# Patient Record
Sex: Female | Born: 1970 | Race: White | Hispanic: No | Marital: Married | Smoking: Current every day smoker
Health system: Southern US, Community
[De-identification: ages and names within clinical notes are randomized; demographics above are authoritative.]

## PROBLEM LIST (undated history)

## (undated) ENCOUNTER — Emergency Department: Admission: EM | Payer: Self-pay | Source: Home / Self Care

---

## 1999-01-18 ENCOUNTER — Inpatient Hospital Stay (HOSPITAL_COMMUNITY): Admission: AD | Admit: 1999-01-18 | Discharge: 1999-01-21 | Payer: Self-pay | Admitting: Obstetrics and Gynecology

## 2001-02-24 ENCOUNTER — Other Ambulatory Visit: Admission: RE | Admit: 2001-02-24 | Discharge: 2001-02-24 | Payer: Self-pay | Admitting: Obstetrics and Gynecology

## 2002-03-08 ENCOUNTER — Other Ambulatory Visit: Admission: RE | Admit: 2002-03-08 | Discharge: 2002-03-08 | Payer: Self-pay | Admitting: Obstetrics and Gynecology

## 2003-06-08 ENCOUNTER — Inpatient Hospital Stay (HOSPITAL_COMMUNITY): Admission: AD | Admit: 2003-06-08 | Discharge: 2003-06-10 | Payer: Self-pay | Admitting: Obstetrics and Gynecology

## 2003-07-08 ENCOUNTER — Other Ambulatory Visit: Admission: RE | Admit: 2003-07-08 | Discharge: 2003-07-08 | Payer: Self-pay | Admitting: Obstetrics & Gynecology

## 2004-07-30 ENCOUNTER — Other Ambulatory Visit: Admission: RE | Admit: 2004-07-30 | Discharge: 2004-07-30 | Payer: Self-pay | Admitting: Obstetrics and Gynecology

## 2008-03-02 IMAGING — US MAMMO-LUNI-US
1 series · 4 of 4 positions shown · non-contrast
Comparison: NONE

CLINICAL DATA: Mahefa Galon, RT(R)(M)(CT)  Diagnostic 
mammogram.  

LEFT BREAST MAMMOGRAM ADDITIONAL VIEWS AND LEFT BREAST ULTRASOUND

[Series 1: us breast · 0.07mm/px · 4 of 4 slices shown]
[im 1/4]
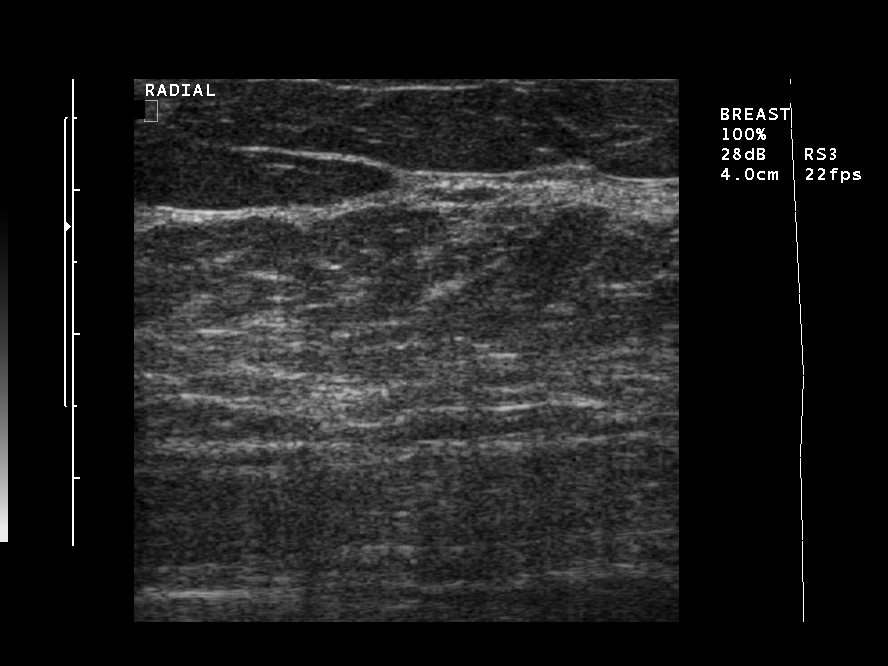
[im 2/4]
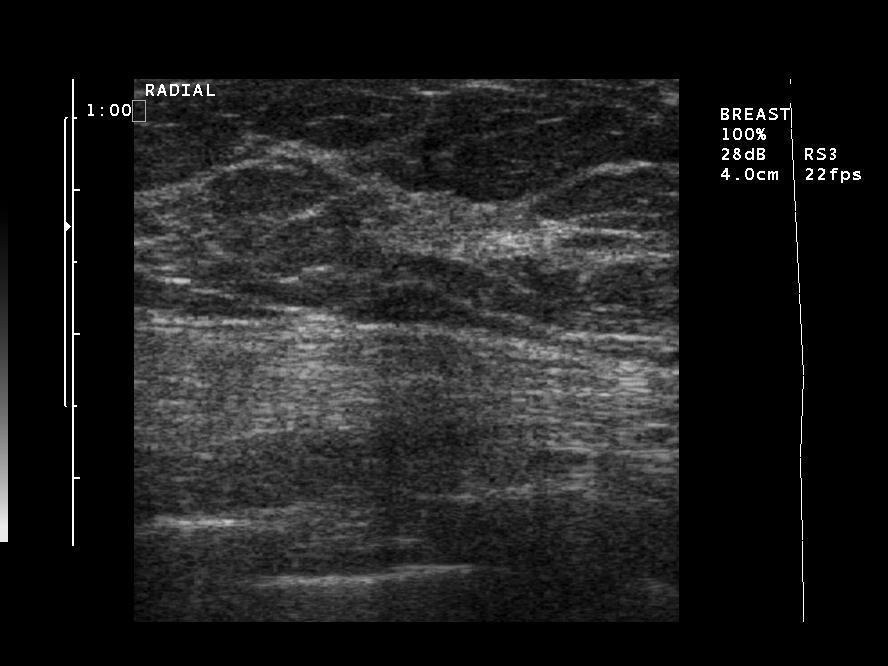
[im 3/4]
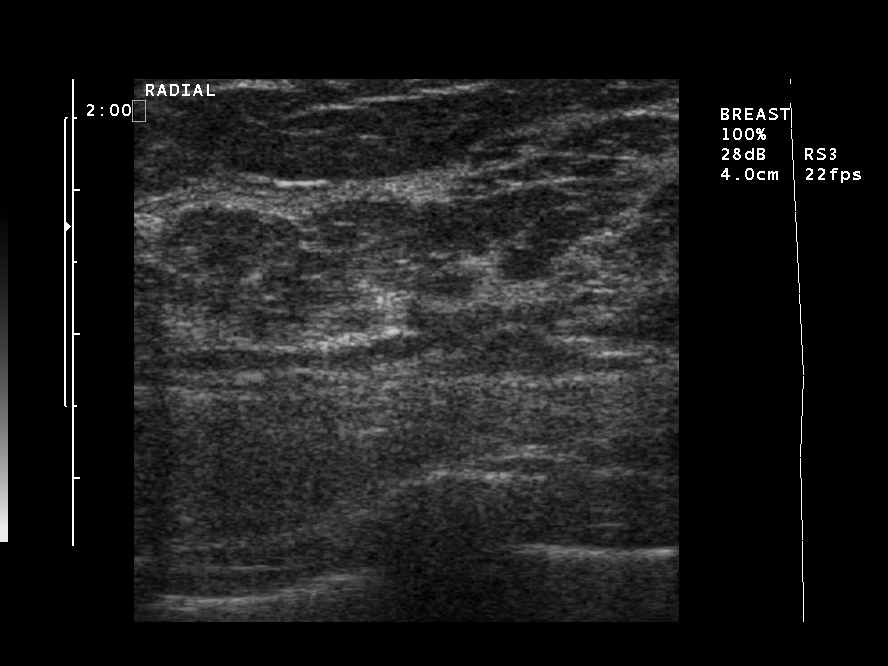
[im 4/4]
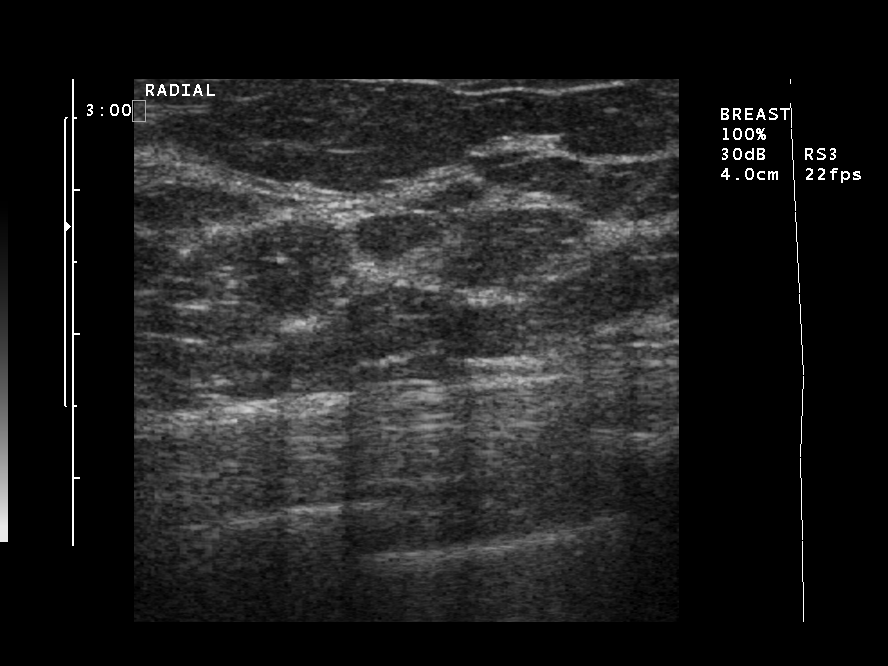

[4 of 4 positions shown; findings below may reference images not displayed]

FINDINGS: There is mild parenchymal breast density. A spot 
compression view in the true lateral position was obtained as well 
as a spot compression view of the outer aspect of the left breast 
in the CC view. Exaggerated medial and lateral CC images were also 
obtained. There is an oval-shaped mass seen in the upper outer 
quadrant. This measures approximately 9 x 6 x 5 mm. This appears 
to have a fatty hilum. The margin appears to be well circumscribed 
with gradual undulation on the MLO view. The margin is partially 
obscured by adjacent tissue on the lateral exaggerated CC view. 
There are no malignant-appearing microcalcifications or 
architectural distortion. Ultrasound was performed of the upper 
half of the left breast. I scanned the patient as well. There were 
no solid or cystic masses seen on ultrasound. No abnormal acoustic 
attenuation.
IMPRESSION: Oval-shaped mass seen in the upper outer quadrant of 
the left breast. This has mammographic features suggesting a lymph 
node. This was not visualized on ultrasound. I would recommend a 
3-month interval follow-up left breast mammogram to assess 
stability of this finding. The patient was informed at the time of 
the examination of the findings and recommendations by verbal and 
written lay report. Computer assisted (Second Look) technology was 
used as an aid in interpretation of this study. BI-RADS 3 - 
12/23/2006 Dict Date: 12/22/2006  Trans Date: 12/23/2006 JH  [REDACTED]

## 2016-11-22 ENCOUNTER — Emergency Department (HOSPITAL_BASED_OUTPATIENT_CLINIC_OR_DEPARTMENT_OTHER): Payer: 59

## 2016-11-22 ENCOUNTER — Emergency Department (HOSPITAL_BASED_OUTPATIENT_CLINIC_OR_DEPARTMENT_OTHER)
Admission: EM | Admit: 2016-11-22 | Discharge: 2016-11-22 | Disposition: A | Discharge status: Home / Self Care | Payer: 59 | Attending: Emergency Medicine | Admitting: Emergency Medicine

## 2016-11-22 ENCOUNTER — Encounter (HOSPITAL_BASED_OUTPATIENT_CLINIC_OR_DEPARTMENT_OTHER): Payer: Self-pay

## 2016-11-22 DIAGNOSIS — R002 Palpitations: Secondary | ICD-10-CM | POA: Insufficient documentation

## 2016-11-22 DIAGNOSIS — F17210 Nicotine dependence, cigarettes, uncomplicated: Secondary | ICD-10-CM | POA: Insufficient documentation

## 2016-11-22 LAB — CBC
HCT: 42.1 % (ref 36.0–46.0)
Hemoglobin: 14 g/dL (ref 12.0–15.0)
MCH: 29.5 pg (ref 26.0–34.0)
MCHC: 33.3 g/dL (ref 30.0–36.0)
MCV: 88.8 fL (ref 78.0–100.0)
PLATELETS: 338 10*3/uL (ref 150–400)
RBC: 4.74 MIL/uL (ref 3.87–5.11)
RDW: 12.9 % (ref 11.5–15.5)
WBC: 8.4 10*3/uL (ref 4.0–10.5)

## 2016-11-22 LAB — BASIC METABOLIC PANEL
Anion gap: 11 (ref 5–15)
BUN: 10 mg/dL (ref 6–20)
CO2: 23 mmol/L (ref 22–32)
CREATININE: 0.67 mg/dL (ref 0.44–1.00)
Calcium: 9.3 mg/dL (ref 8.9–10.3)
Chloride: 101 mmol/L (ref 101–111)
GFR calc Af Amer: >60 mL/min (ref >60)
Glucose, Bld: 127 mg/dL — ABNORMAL HIGH (ref 65–99)
Potassium: 3.5 mmol/L (ref 3.5–5.1)
SODIUM: 135 mmol/L (ref 135–145)

## 2016-11-22 LAB — TROPONIN I
Troponin I: <0.03 ng/mL (ref <0.03)
Troponin I: <0.03 ng/mL (ref <0.03)

## 2016-11-22 LAB — TSH: TSH: 3.948 u[IU]/mL (ref 0.350–4.500)

## 2016-11-22 NOTE — ED Notes (Signed)
Pt states she feels like heart was racing almost like panic attack that she has had in the past, palpations  Come and go  She states

## 2016-11-22 NOTE — ED Provider Notes (Signed)
Pt signed out to me at shift change by A. Law PA-C. She has had palpitations and chest squeezing since this morning intermittently. Delta trop and TSH pending.  Delta troponin is negative. She has been asymptomatic throughout her ED stay. Will d/c with cardiology f/u.   Linda Wheeler, Linda Aguilera Marie, PA-C 11/22/16 Pearletha [id number]    Schlossman, Erin, MD 11/27/16 [phone]

## 2016-11-22 NOTE — ED Provider Notes (Addendum)
[facility]   CSN: [id number] Arrival date & time: 11/22/16  1418     History   Chief Complaint Chief Complaint  Patient presents with  . Palpitations    HPI Linda Wheeler is a 46 y.o. female who is previously healthy who presents with palpitations. Patient reports waking up around 6:30 AM this morning and feeling a feeling like her heart was racing in addition to a squeezing in her left-sided chest. It lasted for a few seconds and resolved. Patient had a few other episodes, but has not had an episode in a few hours. She denies any significant chest pain, but did note the squeezing above. She denies any shortness of breath. Patient does have a family history of heart problems, but no personal cardiac history. She denies any abdominal pain, nausea, vomiting, urinary symptoms.  HPI  History reviewed. No pertinent past medical history.  There are no active problems to display for this patient.   History reviewed. No pertinent surgical history.  OB History    No data available       Home Medications    Prior to Admission medications   Not on File    Family History No family history on file.  Social History Social History  Substance Use Topics  . Smoking status: Current Every Day Smoker  . Smokeless tobacco: Never Used  . Alcohol use Yes     Comment: weekly     Allergies   Patient has no known allergies.   Review of Systems Review of Systems   Physical Exam Updated Vital Signs BP (!) 159/95 (BP Location: Left Arm)   Pulse 80   Temp 98.2 F (36.8 C) (Oral)   Resp 20   Ht 5\' 2"  (1.575 m)   Wt 97.5 kg (215 lb)   LMP 11/12/2016   SpO2 99%   BMI 39.32 kg/m   Physical Exam  Constitutional: She appears well-developed and well-nourished. No distress.  HENT:  Head: Normocephalic and atraumatic.  Mouth/Throat: Oropharynx is clear and moist. No oropharyngeal exudate.  Eyes: Pupils are equal, round, and reactive to light.  Conjunctivae are normal. Right eye exhibits no discharge. Left eye exhibits no discharge. No scleral icterus.  Neck: Normal range of motion. Neck supple. No thyromegaly present.  Cardiovascular: Normal rate, regular rhythm, normal heart sounds and intact distal pulses.  Exam reveals no gallop and no friction rub.   No murmur heard. Pulmonary/Chest: Effort normal and breath sounds normal. No stridor. No respiratory distress. She has no wheezes. She has no rales.  Abdominal: Soft. Bowel sounds are normal. She exhibits no distension. There is no tenderness. There is no rebound and no guarding.  Musculoskeletal: She exhibits no edema.  Lymphadenopathy:    She has no cervical adenopathy.  Neurological: She is alert. Coordination normal.  Skin: Skin is warm and dry. No rash noted. She is not diaphoretic. No pallor.  Psychiatric: She has a normal mood and affect.  Nursing note and vitals reviewed.    ED Treatments / Results  Labs (all labs ordered are listed, but only abnormal results are displayed) Labs Reviewed  BASIC METABOLIC PANEL - Abnormal; Notable for the following:       Result Value   Glucose, Bld 127 (*)    All other components within normal limits  CBC  TROPONIN I  TSH  TROPONIN I    EKG  EKG Interpretation  Date/Time:  Monday November 22 2016 14:22:31 EDT Ventricular Rate:  77 PR Interval:  150 QRS Duration: 96 QT Interval:  382 QTC Calculation: 432 R Axis:   85 Text Interpretation:  Normal sinus rhythm Cannot rule out Anterior infarct , age undetermined Abnormal ECG No STEMI.  Confirmed by Alona BeneLong, Joshua [phone]([id number]) on 11/22/2016 3:09:21 PM       Radiology Dg Chest 2 View  Result Date: 11/22/2016 CLINICAL DATA:  Palpitations EXAM: CHEST  2 VIEW COMPARISON:  None. FINDINGS: The heart size and mediastinal contours are within normal limits. Both lungs are clear. The visualized skeletal structures are unremarkable. IMPRESSION: No active cardiopulmonary disease. Electronically  Signed   By: Alcide CleverMark  Lukens M.D.   On: 11/22/2016 14:43    Procedures Procedures (including critical care time)  Medications Ordered in ED Medications - No data to display   Initial Impression / Assessment and Plan / ED Course  I have reviewed the triage vital signs and the nursing notes.  Pertinent labs & imaging results that were available during my care of the patient were reviewed by me and considered in my medical decision making (see chart for details).     CBC, BMP unremarkable. Initial troponin negative, delta pending. TSH pending. CXR is negative. EKG shows NSR. At shift change, patient care transferred to Terance HartKelly Gekas, PA-C for continued evaluation, follow up of delta troponin and TSH and determination of disposition. Anticipate discharge if TSH and delta trop negative. Follow-up to cardiology for further evaluation.   Final Clinical Impressions(s) / ED Diagnoses   Final diagnoses:  Palpitations    New Prescriptions New Prescriptions   No medications on file     Verdis PrimeLaw, Deantre Bourdon M, PA-C 11/22/16 1645    Long, Arlyss RepressJoshua G, MD 11/22/16 1921    Emi HolesLaw, Daysha Ashmore M, PA-C 11/29/16 1229    Maia PlanLong, Joshua G, MD 11/29/16 [phone]

## 2016-11-22 NOTE — Discharge Instructions (Signed)
Please follow-up with Dr. Jens Somrenshaw, a cardiologist, for further evaluation and treatment of her symptoms. Please return to the emergency department if you develop any new or worsening symptoms. Make sure to stay well-hydrated and avoid caffeine in excess amounts.

## 2016-11-22 NOTE — ED Triage Notes (Signed)
C/o palpitations x today-denies pain-NAD-steady gait

## 2018-02-01 IMAGING — DX DG CHEST 2V
2 series · 2 of 2 positions shown · non-contrast
Comparison: None.

CLINICAL DATA: Palpitations

EXAM:
CHEST  2 VIEW

[chest pa]
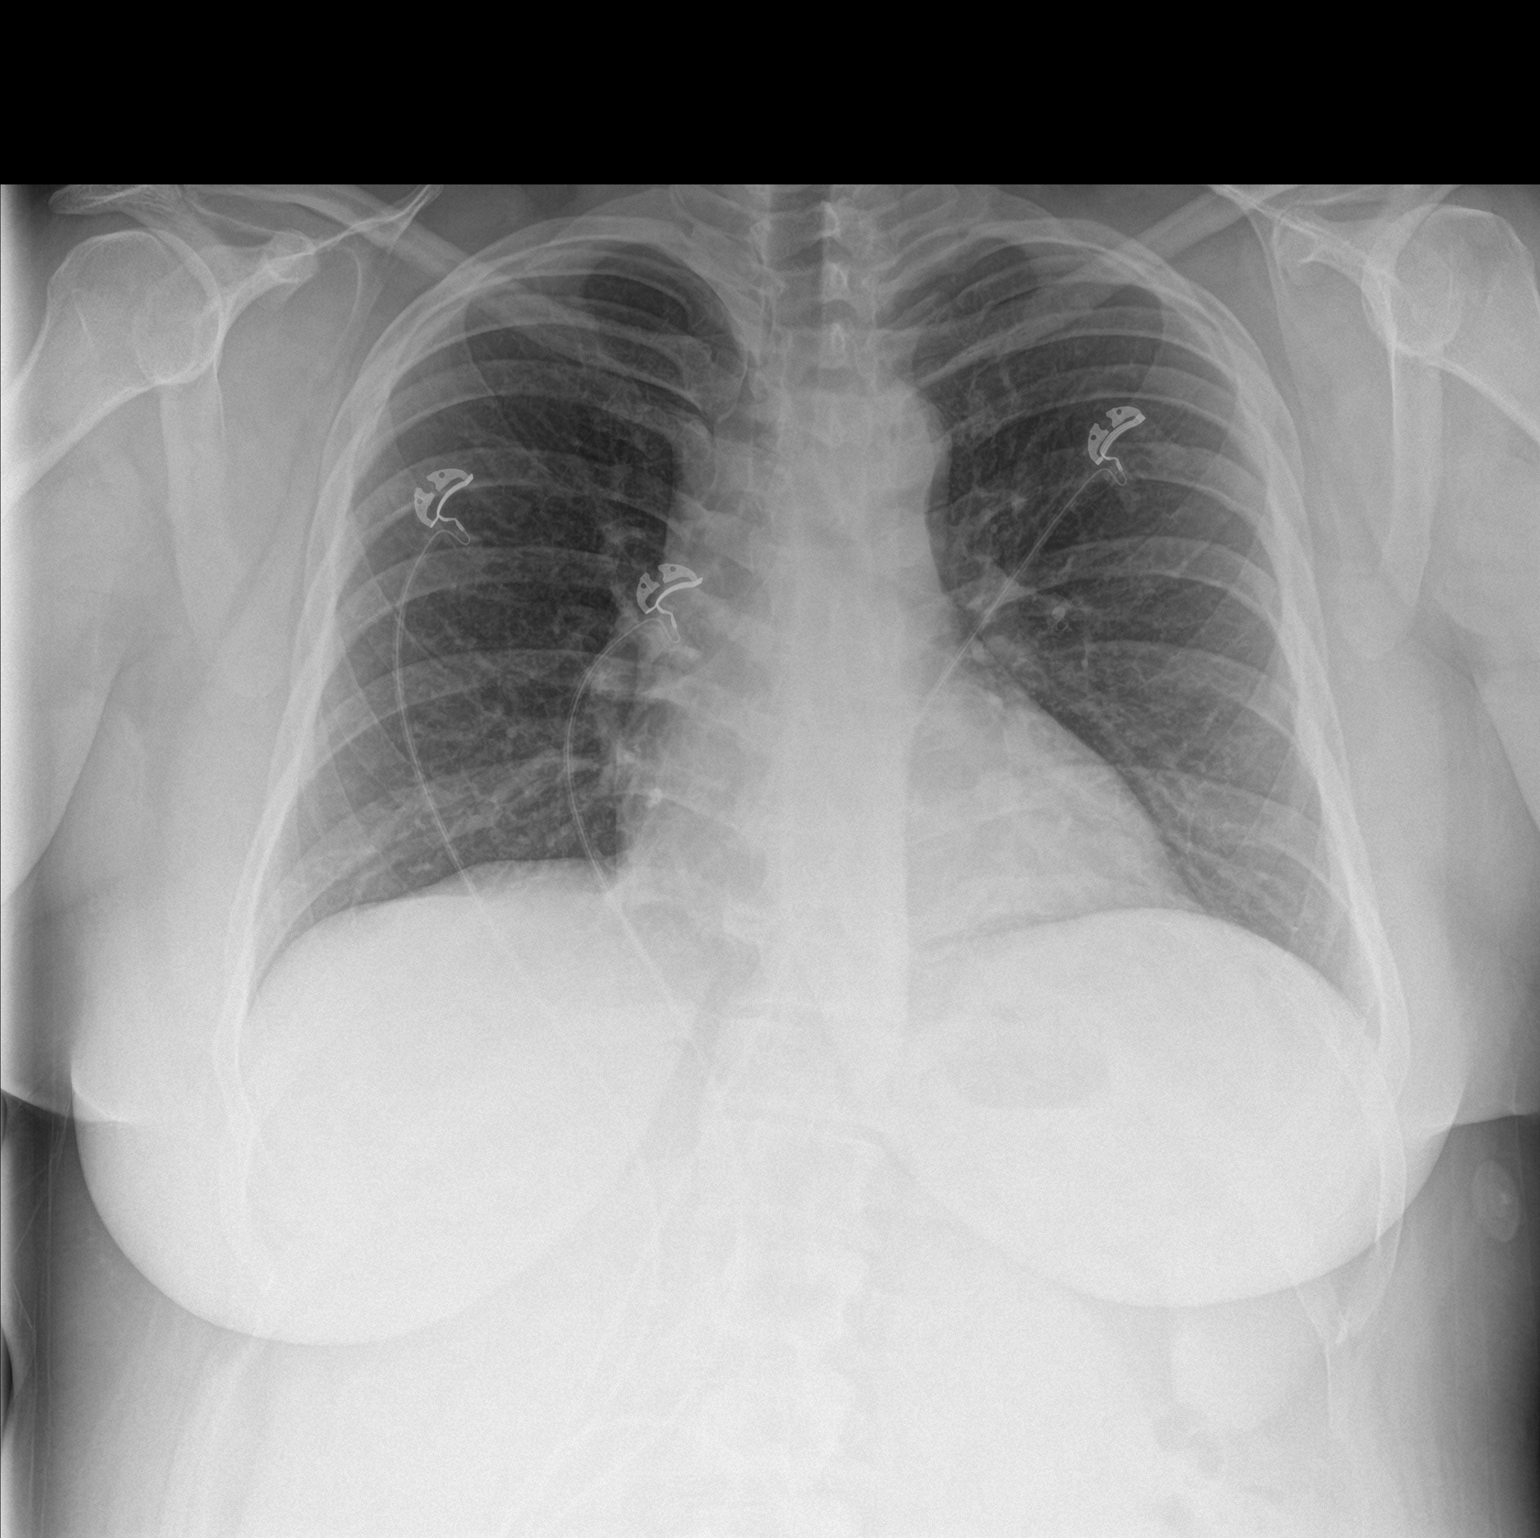

[chest lat]
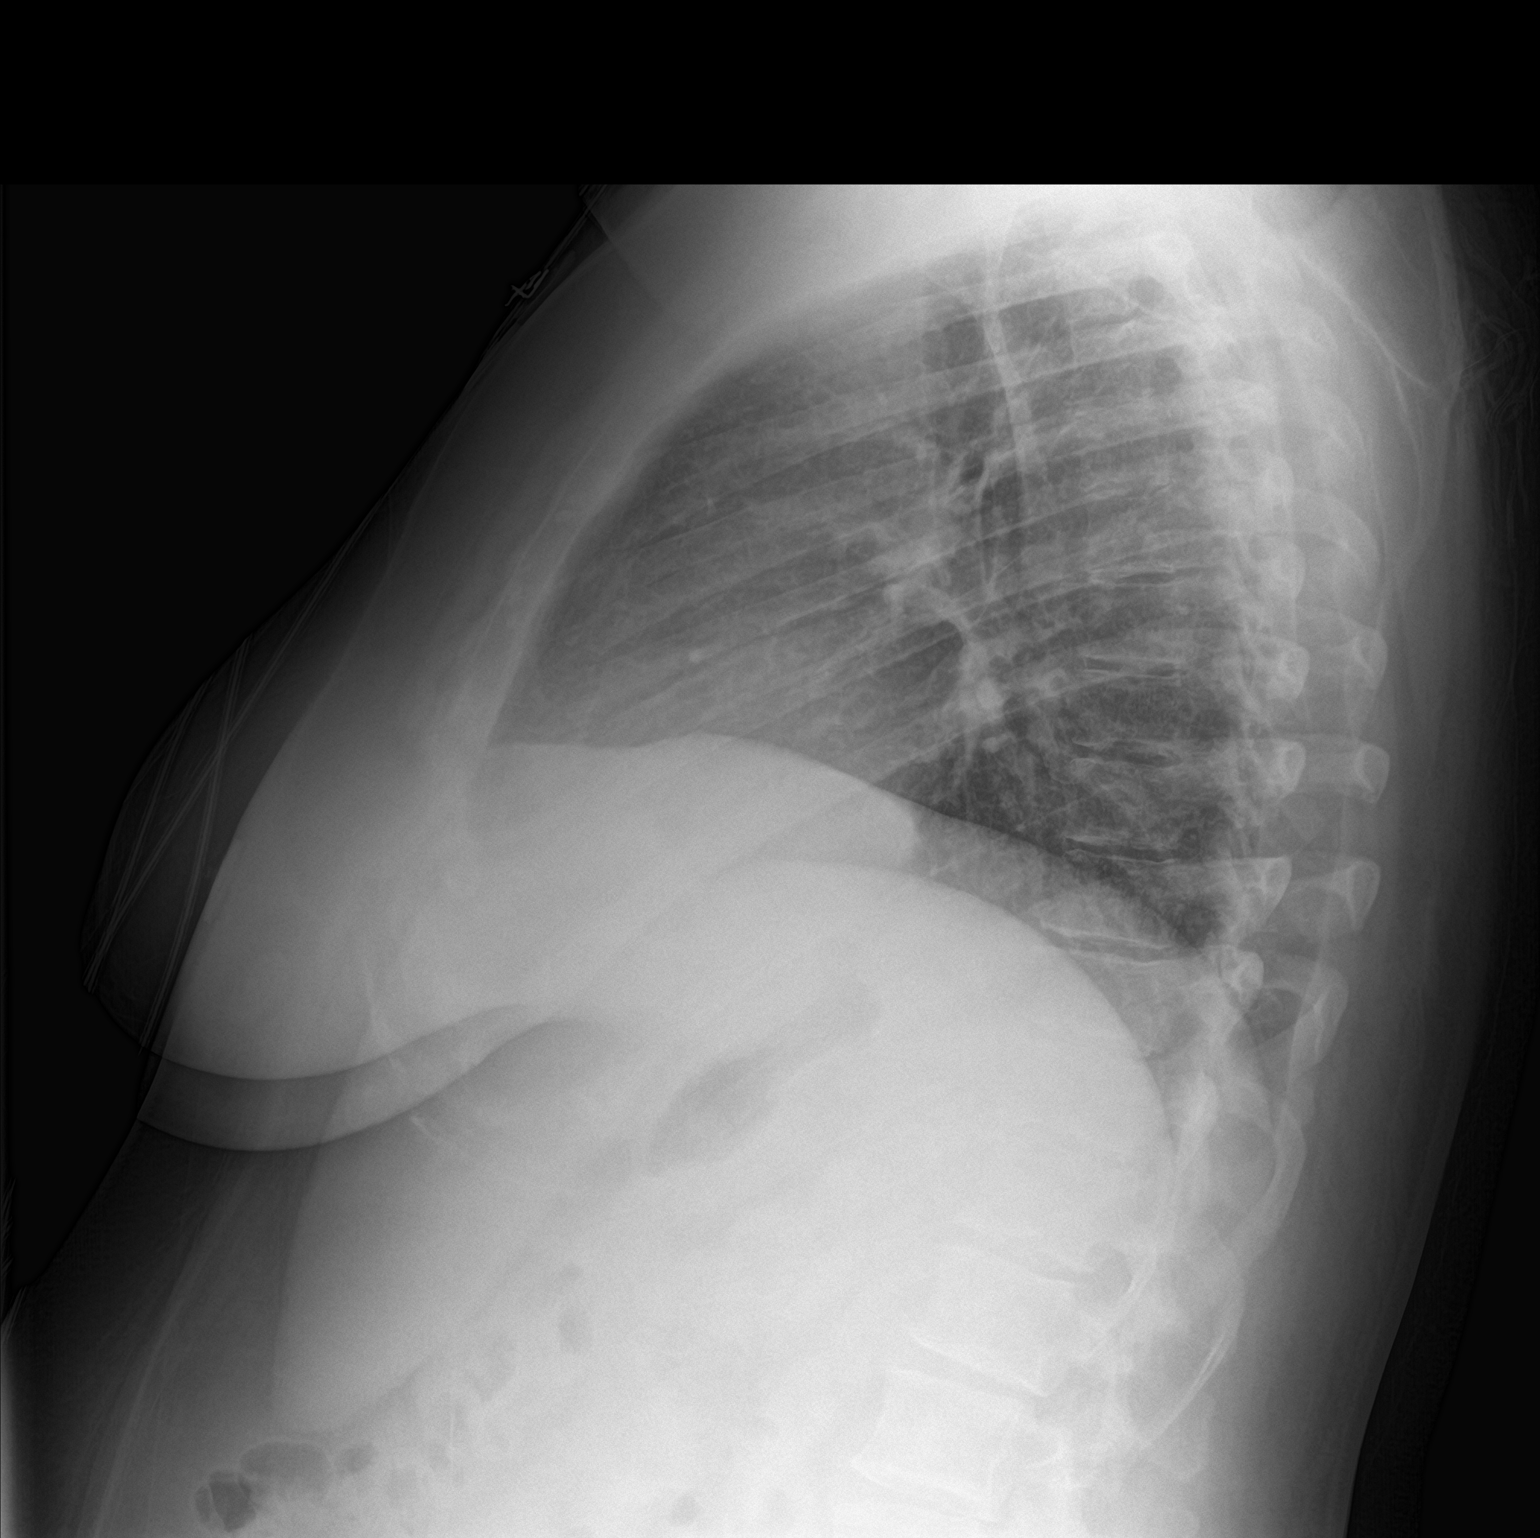

[2 of 2 positions shown; findings below may reference images not displayed]

FINDINGS: The heart size and mediastinal contours are within normal limits.
Both lungs are clear. The visualized skeletal structures are
unremarkable.
IMPRESSION: No active cardiopulmonary disease.

## 2019-09-05 ENCOUNTER — Ambulatory Visit: Payer: 59 | Attending: Internal Medicine

## 2019-09-05 DIAGNOSIS — Z23 Encounter for immunization: Secondary | ICD-10-CM

## 2019-09-05 NOTE — Progress Notes (Signed)
   Covid-19 Vaccination Clinic  Name:  Linda Wheeler    MRN: [id number] DOB: 09-Nov-1970  09/05/2019  [name] was observed post Covid-19 immunization for 15 minutes without incident. She was provided with Vaccine Information Sheet and instruction to access the V-Safe system.   [name] was instructed to call 911 with any severe reactions post vaccine: Marland Kitchen Difficulty breathing  . Swelling of face and throat  . A fast heartbeat  . A bad rash all over body  . Dizziness and weakness   Immunizations Administered    Name Date Dose VIS Date Route   Pfizer COVID-19 Vaccine 09/05/2019  1:00 PM 0.3 mL 06/13/2018 Intramuscular   Manufacturer: [employer], [employer]   Lot: [id number]   NDC: [address]-7

## 2019-09-26 ENCOUNTER — Ambulatory Visit: Payer: 59 | Attending: Internal Medicine

## 2019-09-26 DIAGNOSIS — Z23 Encounter for immunization: Secondary | ICD-10-CM

## 2019-09-26 NOTE — Progress Notes (Signed)
   Covid-19 Vaccination Clinic  Name:  Linda Wheeler    MRN: [id number] DOB: October 20, 1970  09/26/2019  [name] was observed post Covid-19 immunization for 15 minutes without incident. She was provided with Vaccine Information Sheet and instruction to access the V-Safe system.   [name] was instructed to call 911 with any severe reactions post vaccine: Marland Kitchen Difficulty breathing  . Swelling of face and throat  . A fast heartbeat  . A bad rash all over body  . Dizziness and weakness   Immunizations Administered    Name Date Dose VIS Date Route   Pfizer COVID-19 Vaccine 09/26/2019  3:26 PM 0.3 mL 06/13/2018 Intramuscular   Manufacturer: [employer], [employer]   Lot: [id number]   NDC: [address]-0
